# Patient Record
Sex: Male | Born: 1980 | Race: White | Hispanic: No | Marital: Single | Smoking: Current every day smoker
Health system: Southern US, Community
[De-identification: ages and names within clinical notes are randomized; demographics above are authoritative.]

## PROBLEM LIST (undated history)

## (undated) DIAGNOSIS — I10 Essential (primary) hypertension: Secondary | ICD-10-CM

## (undated) DIAGNOSIS — G8929 Other chronic pain: Secondary | ICD-10-CM

---

## 2004-09-11 ENCOUNTER — Emergency Department: Payer: Self-pay | Admitting: Emergency Medicine

## 2004-09-13 ENCOUNTER — Emergency Department: Payer: Self-pay | Admitting: Emergency Medicine

## 2008-01-08 ENCOUNTER — Emergency Department: Payer: Self-pay | Admitting: Emergency Medicine

## 2014-05-10 ENCOUNTER — Ambulatory Visit: Payer: Self-pay | Admitting: Internal Medicine

## 2014-05-21 ENCOUNTER — Ambulatory Visit: Payer: Self-pay | Admitting: Internal Medicine

## 2015-11-01 ENCOUNTER — Emergency Department
Admission: EM | Admit: 2015-11-01 | Discharge: 2015-11-01 | Disposition: A | Discharge status: Home / Self Care | Payer: Self-pay | Attending: Emergency Medicine | Admitting: Emergency Medicine

## 2015-11-01 ENCOUNTER — Emergency Department: Payer: Self-pay

## 2015-11-01 DIAGNOSIS — N390 Urinary tract infection, site not specified: Secondary | ICD-10-CM | POA: Insufficient documentation

## 2015-11-01 DIAGNOSIS — F17210 Nicotine dependence, cigarettes, uncomplicated: Secondary | ICD-10-CM | POA: Insufficient documentation

## 2015-11-01 DIAGNOSIS — I10 Essential (primary) hypertension: Secondary | ICD-10-CM | POA: Insufficient documentation

## 2015-11-01 DIAGNOSIS — N200 Calculus of kidney: Secondary | ICD-10-CM | POA: Insufficient documentation

## 2015-11-01 HISTORY — DX: Essential (primary) hypertension: I10

## 2015-11-01 LAB — BASIC METABOLIC PANEL
Anion gap: 10 (ref 5–15)
BUN: 14 mg/dL (ref 6–20)
CALCIUM: 9.6 mg/dL (ref 8.9–10.3)
CO2: 25 mmol/L (ref 22–32)
CREATININE: 1.05 mg/dL (ref 0.61–1.24)
Chloride: 102 mmol/L (ref 101–111)
GFR calc non Af Amer: >60 mL/min (ref >60)
Glucose, Bld: 105 mg/dL — ABNORMAL HIGH (ref 65–99)
Potassium: 4.1 mmol/L (ref 3.5–5.1)
SODIUM: 137 mmol/L (ref 135–145)

## 2015-11-01 LAB — CBC WITH DIFFERENTIAL/PLATELET
BASOS PCT: 0 %
Basophils Absolute: 0.1 10*3/uL (ref 0–0.1)
EOS ABS: 0 10*3/uL (ref 0–0.7)
EOS PCT: 0 %
HEMATOCRIT: 49.1 % (ref 40.0–52.0)
Hemoglobin: 17.2 g/dL (ref 13.0–18.0)
Lymphocytes Relative: 9 %
Lymphs Abs: 1.6 10*3/uL (ref 1.0–3.6)
MCH: 30 pg (ref 26.0–34.0)
MCHC: 35.1 g/dL (ref 32.0–36.0)
MCV: 85.4 fL (ref 80.0–100.0)
MONO ABS: 1.3 10*3/uL — AB (ref 0.2–1.0)
MONOS PCT: 7 %
Neutro Abs: 14.6 10*3/uL — ABNORMAL HIGH (ref 1.4–6.5)
Neutrophils Relative %: 84 %
PLATELETS: 267 10*3/uL (ref 150–440)
RBC: 5.75 MIL/uL (ref 4.40–5.90)
RDW: 13.6 % (ref 11.5–14.5)
WBC: 17.6 10*3/uL — ABNORMAL HIGH (ref 3.8–10.6)

## 2015-11-01 LAB — URINALYSIS COMPLETE WITH MICROSCOPIC (ARMC ONLY)
BILIRUBIN URINE: NEGATIVE
Bacteria, UA: NONE SEEN
GLUCOSE, UA: 50 mg/dL — AB
KETONES UR: NEGATIVE mg/dL
LEUKOCYTES UA: NEGATIVE
Nitrite: NEGATIVE
Protein, ur: 30 mg/dL — AB
SPECIFIC GRAVITY, URINE: 1.019 (ref 1.005–1.030)
Squamous Epithelial / LPF: NONE SEEN
pH: 5 (ref 5.0–8.0)

## 2015-11-01 MED ORDER — CEPHALEXIN 500 MG PO CAPS
500.0000 mg | ORAL_CAPSULE | Freq: Once | ORAL | Status: AC
  Administered 2015-11-01: 500 mg via ORAL
  Filled 2015-11-01 (×2): qty 1

## 2015-11-01 MED ORDER — OXYCODONE-ACETAMINOPHEN 5-325 MG PO TABS: 1.0000 | ORAL_TABLET | Freq: Four times a day (QID) | ORAL | 0 refills | Status: DC | PRN

## 2015-11-01 MED ORDER — TAMSULOSIN HCL 0.4 MG PO CAPS
0.4000 mg | ORAL_CAPSULE | Freq: Once | ORAL | Status: AC
  Administered 2015-11-01: 0.4 mg via ORAL

## 2015-11-01 MED ORDER — KETOROLAC TROMETHAMINE 30 MG/ML IJ SOLN
30.0000 mg | Freq: Once | INTRAMUSCULAR | Status: AC
  Administered 2015-11-01: 30 mg via INTRAVENOUS
  Filled 2015-11-01: qty 1

## 2015-11-01 MED ORDER — TAMSULOSIN HCL 0.4 MG PO CAPS
ORAL_CAPSULE | ORAL | Status: AC
Start: 2015-11-01 — End: 2015-11-01
  Administered 2015-11-01: 0.4 mg via ORAL
  Filled 2015-11-01: qty 1

## 2015-11-01 MED ORDER — TAMSULOSIN HCL 0.4 MG PO CAPS: 0.4000 mg | ORAL_CAPSULE | Freq: Every day | ORAL | 0 refills | Status: AC

## 2015-11-01 MED ORDER — CEPHALEXIN 500 MG PO CAPS: 500.0000 mg | ORAL_CAPSULE | Freq: Three times a day (TID) | ORAL | 0 refills | Status: AC

## 2015-11-01 MED ORDER — SODIUM CHLORIDE 0.9 % IV BOLUS (SEPSIS)
1000.0000 mL | Freq: Once | INTRAVENOUS | Status: AC
  Administered 2015-11-01: 1000 mL via INTRAVENOUS

## 2015-11-01 NOTE — ED Provider Notes (Signed)
[facility] Emergency Department Provider Note   ____________________________________________   First MD Initiated Contact with Patient 11/01/15 2122     (approximate)  I have reviewed the triage vital signs and the nursing notes.   HISTORY  Chief Complaint Flank Pain   HPI Andre Larsen is a 35 y.o. male with a history of hypertension as well as kidney stones was presenting with 9 out of 10, sharp right lower abdominal as well as flank pain over the past day. He says that the pain started suddenly last night. He has had multiple episodes of nausea and vomiting over the course of the day. Says that the pain has been crampy and intermittent. Says that it feels similar to his previous kidney stones except with more intensity. He has not needed intervention in the past for passage of stones. He does not report any fever. Says that he was also having difficulty urinating earlier today but was able to urinate in the emergency department for urine sample.   Past Medical History:  Diagnosis Date  . Hypertension     There are no active problems to display for this patient.   History reviewed. No pertinent surgical history.  Prior to Admission medications   Not on File    Allergies Review of patient's allergies indicates no known allergies.  No family history on file.  Social History Social History  Substance Use Topics  . Smoking status: Current Every Day Smoker    Packs/day: 0.50    Types: Cigarettes  . Smokeless tobacco: Never Used  . Alcohol use 0.6 oz/week    1 Cans of beer per week    Review of Systems Constitutional: No fever/chills Eyes: No visual changes. ENT: No sore throat. Cardiovascular: Denies chest pain. Respiratory: Denies shortness of breath. Gastrointestinal:   No diarrhea.  No constipation. Genitourinary: Negative for dysuria. Musculoskeletal:  Skin: Negative for rash. Neurological: Negative for headaches, focal  weakness or numbness.  10-point ROS otherwise negative.  ____________________________________________   PHYSICAL EXAM:  VITAL SIGNS: ED Triage Vitals  Enc Vitals Group     BP 11/01/15 2022 (!) 143/100     Pulse Rate 11/01/15 2022 (!) 117     Resp 11/01/15 2022 18     Temp 11/01/15 2022 98.6 F (37 C)     Temp Source 11/01/15 2022 Oral     SpO2 11/01/15 2022 96 %     Weight 11/01/15 2024 270 lb (122.5 kg)     Height 11/01/15 2024 6\' 3"  (1.905 m)     Head Circumference --      Peak Flow --      Pain Score 11/01/15 2029 9     Pain Loc --      Pain Edu? --      Excl. in GC? --     Constitutional: Alert and oriented. Well appearing and in no acute distress. Eyes: Conjunctivae are normal. PERRL. EOMI. Head: Atraumatic. Nose: No congestion/rhinnorhea. Mouth/Throat: Mucous membranes are moist.  Oropharynx non-erythematous. Neck: No stridor.   Cardiovascular: Normal rate, regular rhythm. Grossly normal heart sounds.  Good peripheral circulation. Respiratory: Normal respiratory effort.  No retractions. Lungs CTAB. Gastrointestinal: Soft With mild to moderate right lower quadrant tenderness. No distention. Right sided, low CVA tenderness palpation which is mild. Musculoskeletal: No lower extremity tenderness nor edema.  No joint effusions. Neurologic:  Normal speech and language. No gross focal neurologic deficits are appreciated. No gait instability. Skin:  Skin is warm, dry  and intact. No rash noted. Psychiatric: Mood and affect are normal. Speech and behavior are normal.  ____________________________________________   LABS (all labs ordered are listed, but only abnormal results are displayed)  Labs Reviewed  URINALYSIS COMPLETEWITH MICROSCOPIC ([facility] ONLY) - Abnormal; Notable for the following:       Result Value   Color, Urine YELLOW (*)    APPearance HAZY (*)    Glucose, UA 50 (*)    Hgb urine dipstick 3+ (*)    Protein, ur 30 (*)    All other components within  normal limits  CBC WITH DIFFERENTIAL/PLATELET - Abnormal; Notable for the following:    WBC 17.6 (*)    Neutro Abs 14.6 (*)    Monocytes Absolute 1.3 (*)    All other components within normal limits  BASIC METABOLIC PANEL - Abnormal; Notable for the following:    Glucose, Bld 105 (*)    All other components within normal limits   ____________________________________________  EKG   ____________________________________________  RADIOLOGY  US Renal (Accession [id number]) (Order [id number])  Imaging  Date: 11/01/2015 Department: [facility] EMERGENCY DEPARTMENT Released By/Authorizing: Myrna Blazeravid Matthew Schaevitz, MD (auto-released)  PACS Images   Show images for US Renal  Study Result   CLINICAL DATA:  35 year old male with right flank pain and hematuria.  EXAM: RENAL / URINARY TRACT ULTRASOUND COMPLETE  COMPARISON:  CT dated 09/11/2004  FINDINGS: Right Kidney:  Length: 12.1 cm. Echogenicity within normal limits. There is minimal fullness of the right renal pelvis. No mass or hydronephrosis visualized.  Left Kidney:  Length: 12.6 cm. Echogenicity within normal limits. No mass or hydronephrosis visualized.  Bladder:  Appears normal for degree of bladder distention.  IMPRESSION: Minimal fullness of the right renal pelvis. No hydronephrosis or echogenic stone.   Electronically Signed   By: Andre CollardArash  Radparvar M.D.   On: 11/01/2015 22:59     ____________________________________________   PROCEDURES  Procedure(s) performed:   Procedures  Critical Care performed:   ____________________________________________   INITIAL IMPRESSION / ASSESSMENT AND PLAN / ED COURSE  Pertinent labs & imaging results that were available during my care of the patient were reviewed by me and considered in my medical decision making (see chart for details).  ----------------------------------------- 11:16 PM on  11/01/2015 -----------------------------------------  Patient now rates his pain as a 2 out of 10. Has had no nausea and vomiting in the emergency department. Says his feeling much better and would like to go home at this time. He does have signs of all could be a mild urinary tract infection on his urinalysis. Furthermore, he has a white blood cell count of 17. However, he does not a fever and looks well at this point and is wishing to go home. I discussed with him strict return precautions including any worsening pain, nausea vomiting and especially fever. He denied having a fever prior to arrival. He will also be given a strainer for use at home and follow-up information for urology. He is understanding of the plan and willing to comply.  Clinical Course     ____________________________________________   FINAL CLINICAL IMPRESSION(S) / ED DIAGNOSES  Kidney stone. UTI.    NEW MEDICATIONS STARTED DURING THIS VISIT:  New Prescriptions   No medications on file     Note:  This document was prepared using Dragon voice recognition software and may include unintentional dictation errors.    Myrna Blazeravid Matthew Schaevitz, MD 11/01/15 [phone]

## 2015-11-01 NOTE — ED Notes (Signed)
Pt still in US. Family provided sprite to drink per request.

## 2015-11-01 NOTE — ED Triage Notes (Signed)
Pt reports to ED w/ c/o R flank pain.  Pt has hx of kidney stones and sts that this pain feels similar.  Pt c/o pain w/ urination and sts he has not urinated today.  Pt c/o n/v.  Sts that he is unable to keep down food/drink.  Pt A/Ox4, skin tone even.  Denies CP, SOB, LOC or dizziness.

## 2015-11-01 NOTE — ED Notes (Signed)
Pt c/o of R sided flank pain. Hx of kidney stones. States pain wraps from R lower abdomen to R back.

## 2015-11-01 NOTE — ED Notes (Signed)
Pt is in U/S.

## 2015-11-03 LAB — URINE CULTURE: CULTURE: NO GROWTH

## 2016-06-14 IMAGING — CR DG CHEST 2V
1 series · 2 of 2 positions shown · non-contrast
Comparison: None.

CLINICAL DATA: Shortness of breath, bronchitic symptoms ;
intermittent cough for the past 3 months with worsening of symptoms
last week, nonsmoker

EXAM:
CHEST  2 VIEW

[Series 1: w chest pa · 0.14mm/px · 2 of 2 slices shown]
[im 1/2]
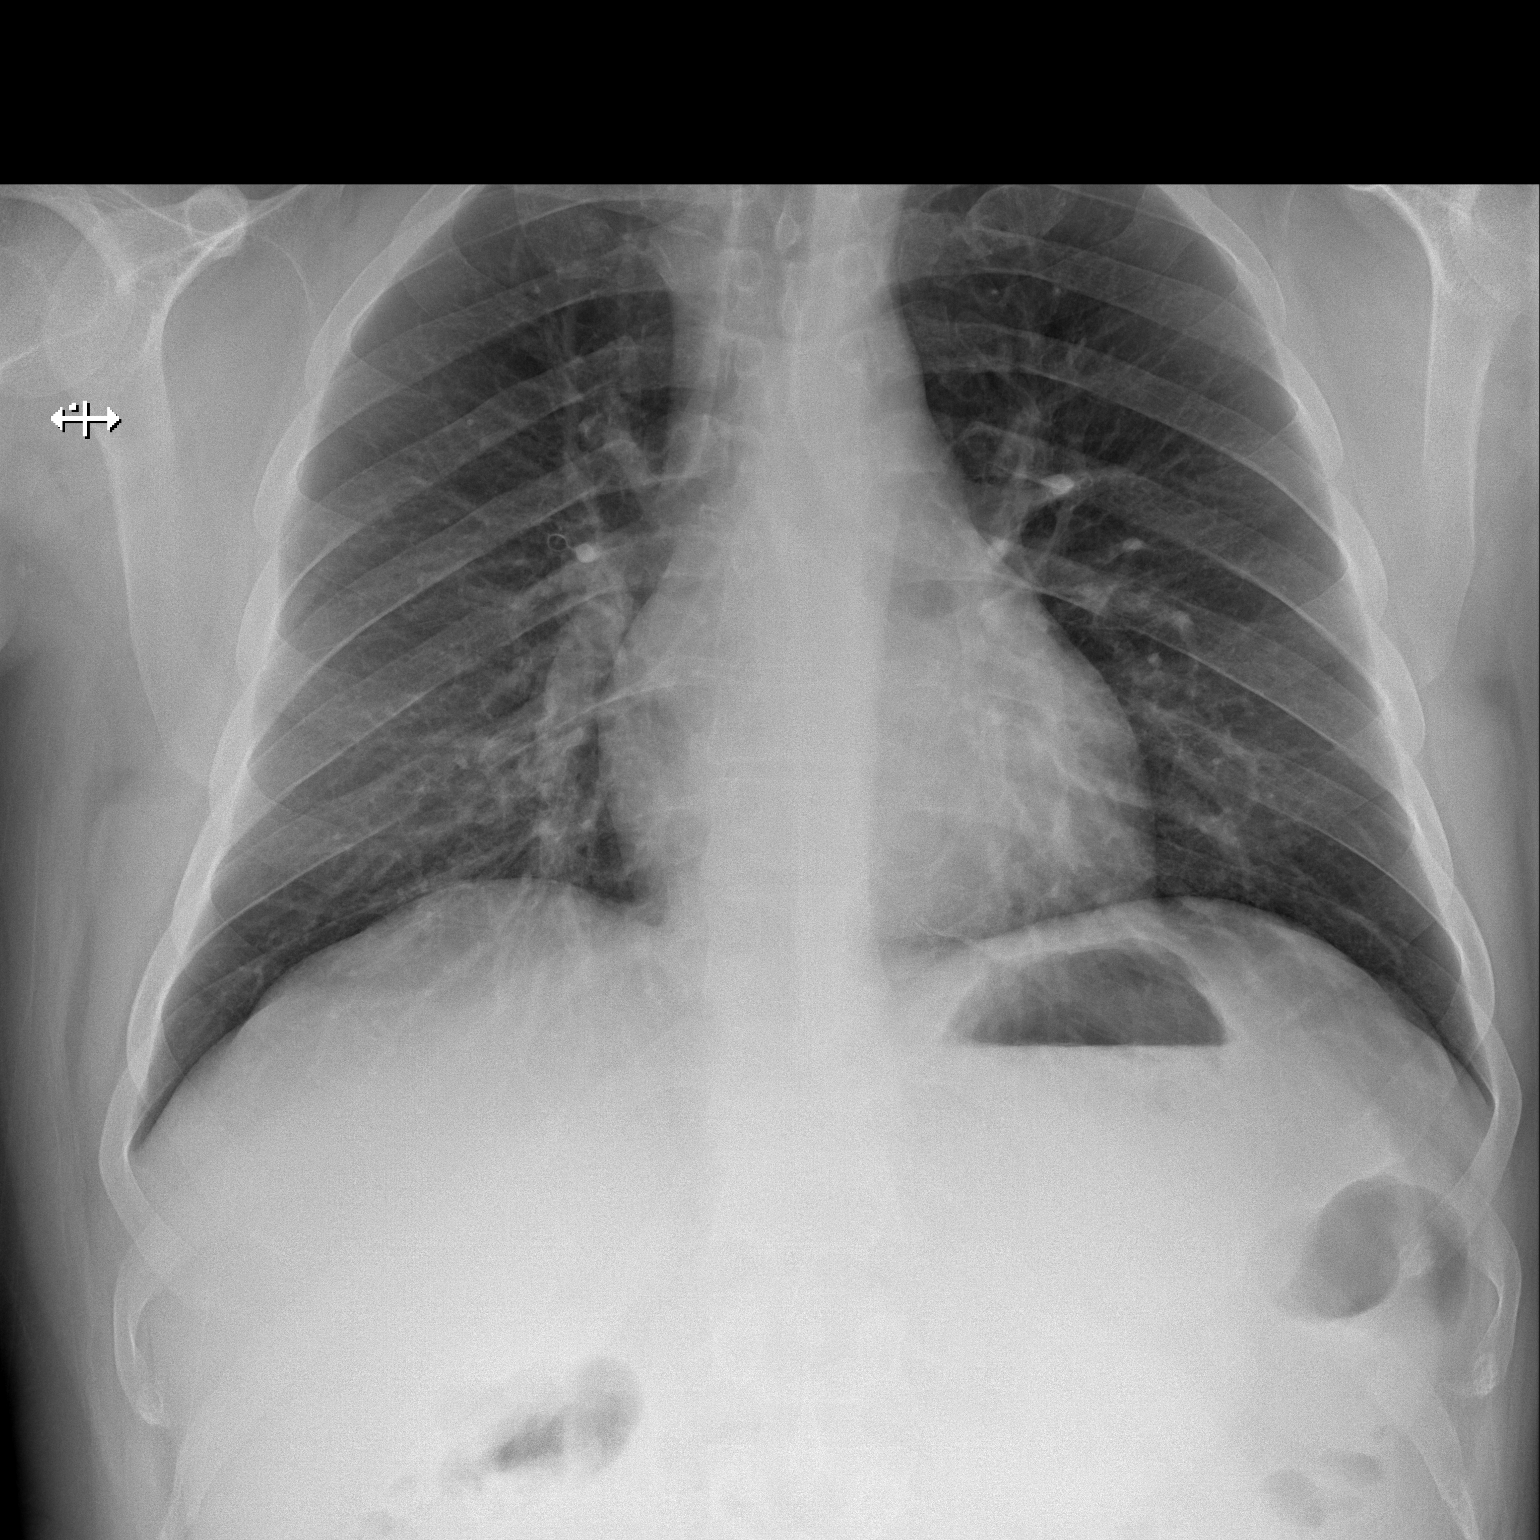
[im 2/2]
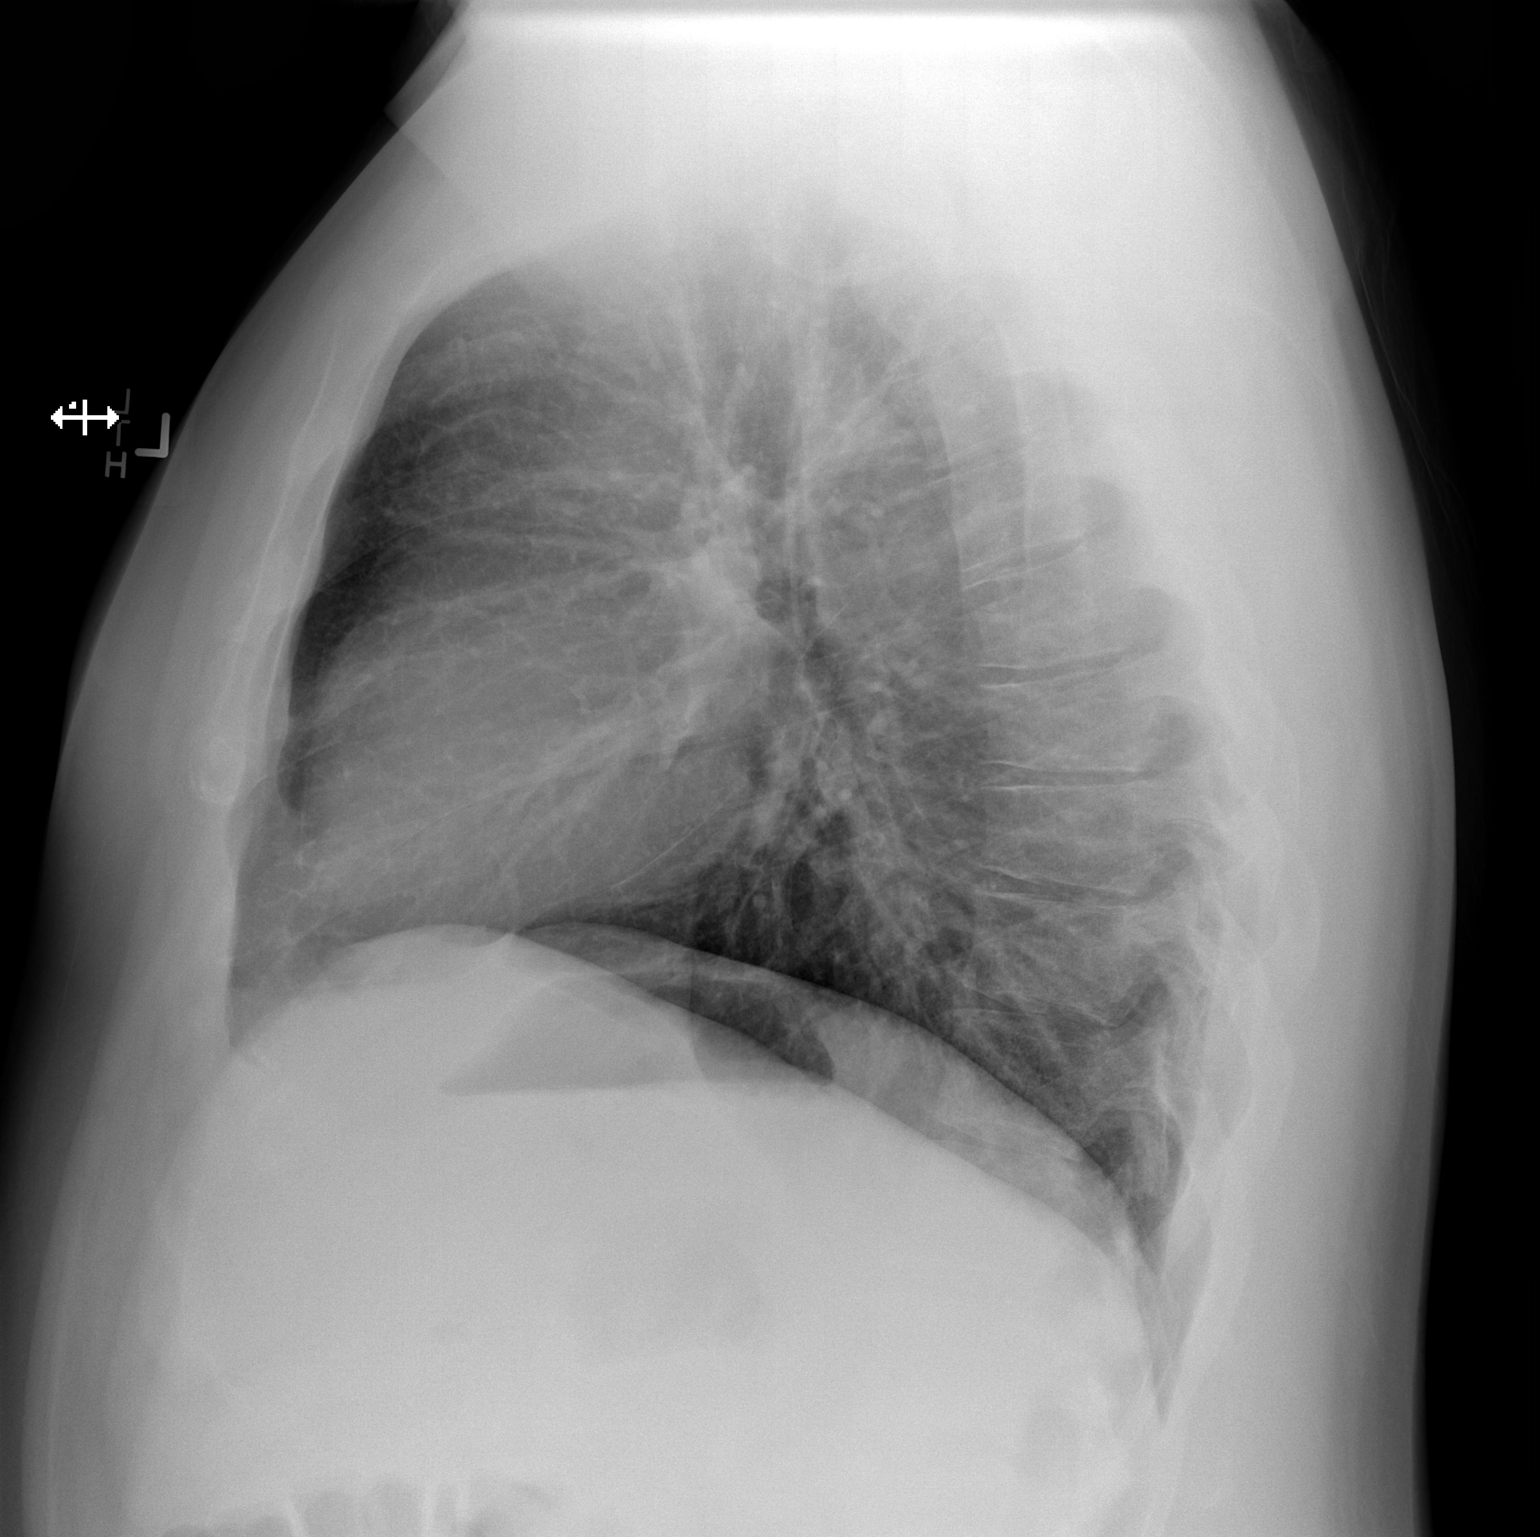

[2 of 2 positions shown; findings below may reference images not displayed]

FINDINGS: The lungs are adequately inflated. Increased interstitial density is
noted bilaterally in the lower lobes. The heart and pulmonary
vascularity are normal. The mediastinum is normal in width. There is
no pleural effusion. The bony thorax is unremarkable.
IMPRESSION: Bibasilar atelectasis or pneumonia. Follow-up radiographs following
anticipated antibiotic therapy are recommended to assure clearing.

## 2021-05-11 ENCOUNTER — Emergency Department (HOSPITAL_COMMUNITY)
Admission: EM | Admit: 2021-05-11 | Discharge: 2021-05-11 | Disposition: A | Discharge status: Home / Self Care | Payer: Self-pay | Attending: Emergency Medicine | Admitting: Emergency Medicine

## 2021-05-11 ENCOUNTER — Emergency Department (HOSPITAL_COMMUNITY): Payer: Self-pay

## 2021-05-11 ENCOUNTER — Encounter (HOSPITAL_COMMUNITY): Payer: Self-pay | Admitting: *Deleted

## 2021-05-11 ENCOUNTER — Other Ambulatory Visit: Payer: Self-pay

## 2021-05-11 DIAGNOSIS — Y92410 Unspecified street and highway as the place of occurrence of the external cause: Secondary | ICD-10-CM | POA: Insufficient documentation

## 2021-05-11 DIAGNOSIS — Z23 Encounter for immunization: Secondary | ICD-10-CM | POA: Insufficient documentation

## 2021-05-11 DIAGNOSIS — S42302A Unspecified fracture of shaft of humerus, left arm, initial encounter for closed fracture: Secondary | ICD-10-CM | POA: Insufficient documentation

## 2021-05-11 DIAGNOSIS — S52502A Unspecified fracture of the lower end of left radius, initial encounter for closed fracture: Secondary | ICD-10-CM | POA: Insufficient documentation

## 2021-05-11 DIAGNOSIS — S52615A Nondisplaced fracture of left ulna styloid process, initial encounter for closed fracture: Secondary | ICD-10-CM | POA: Insufficient documentation

## 2021-05-11 HISTORY — DX: Other chronic pain: G89.29

## 2021-05-11 MED ORDER — HYDROMORPHONE HCL 1 MG/ML IJ SOLN
0.5000 mg | Freq: Once | INTRAMUSCULAR | Status: AC
  Administered 2021-05-11: 0.5 mg via INTRAVENOUS
  Filled 2021-05-11: qty 1

## 2021-05-11 MED ORDER — HYDROMORPHONE HCL 1 MG/ML IJ SOLN
1.0000 mg | Freq: Once | INTRAMUSCULAR | Status: AC
  Administered 2021-05-11: 1 mg via INTRAVENOUS
  Filled 2021-05-11: qty 1

## 2021-05-11 MED ORDER — SODIUM CHLORIDE 0.9 % IV BOLUS
500.0000 mL | Freq: Once | INTRAVENOUS | Status: AC
  Administered 2021-05-11: 500 mL via INTRAVENOUS

## 2021-05-11 MED ORDER — TETANUS-DIPHTH-ACELL PERTUSSIS 5-2.5-18.5 LF-MCG/0.5 IM SUSY
0.5000 mL | PREFILLED_SYRINGE | Freq: Once | INTRAMUSCULAR | Status: AC
  Administered 2021-05-11: 0.5 mL via INTRAMUSCULAR
  Filled 2021-05-11: qty 0.5

## 2021-05-11 MED ORDER — OXYCODONE-ACETAMINOPHEN 5-325 MG PO TABS: 1.0000 | ORAL_TABLET | Freq: Three times a day (TID) | ORAL | 0 refills | Status: AC | PRN

## 2021-05-11 NOTE — ED Notes (Signed)
Called ortho tech 

## 2021-05-11 NOTE — ED Notes (Signed)
Pt to radiology.
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
## 2021-05-11 NOTE — ED Notes (Signed)
Ortho tech at bedside 

## 2021-05-15 ENCOUNTER — Ambulatory Visit: Payer: Self-pay | Admitting: Orthopedic Surgery
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
